# Patient Record
Sex: Female | Born: 1960 | Race: Asian | Hispanic: No | Marital: Single | Smoking: Current every day smoker
Health system: Southern US, Community
[De-identification: ages and names within clinical notes are randomized; demographics above are authoritative.]

## PROBLEM LIST (undated history)

## (undated) DIAGNOSIS — E119 Type 2 diabetes mellitus without complications: Secondary | ICD-10-CM

## (undated) HISTORY — PX: TUBAL LIGATION: SHX77

---

## 2011-10-14 ENCOUNTER — Encounter: Payer: Self-pay | Admitting: *Deleted

## 2011-10-14 ENCOUNTER — Emergency Department
Admission: EM | Admit: 2011-10-14 | Discharge: 2011-10-14 | Disposition: A | Discharge status: Home / Self Care | Payer: Self-pay | Source: Home / Self Care | Attending: Family Medicine | Admitting: Family Medicine

## 2011-10-14 ENCOUNTER — Emergency Department (INDEPENDENT_AMBULATORY_CARE_PROVIDER_SITE_OTHER): Payer: Self-pay

## 2011-10-14 DIAGNOSIS — R102 Pelvic and perineal pain: Secondary | ICD-10-CM

## 2011-10-14 DIAGNOSIS — N83209 Unspecified ovarian cyst, unspecified side: Secondary | ICD-10-CM

## 2011-10-14 DIAGNOSIS — N83201 Unspecified ovarian cyst, right side: Secondary | ICD-10-CM

## 2011-10-14 DIAGNOSIS — N949 Unspecified condition associated with female genital organs and menstrual cycle: Secondary | ICD-10-CM

## 2011-10-14 LAB — POCT URINALYSIS DIPSTICK
Ketones, UA: NEGATIVE
Spec Grav, UA: 1.015 (ref 1.005–1.03)
Urobilinogen, UA: 0.2 (ref 0–1)

## 2011-10-14 LAB — POCT CBC W AUTO DIFF (K'VILLE URGENT CARE)

## 2011-10-14 NOTE — ED Provider Notes (Signed)
History     CSN: [id number]  Arrival date & time 10/14/11  4098   First MD Initiated Contact with Patient 10/14/11 [phone]      Chief Complaint  Patient presents with  . Pelvic Pain     HPI Comments: Patient able to provide basic history, facilitated by her English speaking daughter. She complains of one week history of intermittent right lower quadrant pain, sometimes radiating to back.  No vaginal bleeding or discharge.  Patient's last menstrual period was 04/16/2011.  No urinary symptoms.  No fever.  She notes that her pain improves with Ibuprofen 400mg . Past history of BTL.  She has not had a pap smear in the past.  No recent pelvic exams  Patient is a 51 y.o. female presenting with abdominal pain. The history is provided by the patient and a relative. The history is limited by a language barrier. No language interpreter was used.  Abdominal Pain The primary symptoms of the illness include abdominal pain. The primary symptoms of the illness do not include fever, fatigue, shortness of breath, nausea, vomiting, diarrhea, dysuria, vaginal discharge or vaginal bleeding. Episode onset: 1 week ago. The onset of the illness was sudden.  Associated with: nothing. The patient has not had a change in bowel habit. Risk factors for an acute abdominal problem include a history of abdominal surgery. Additional symptoms associated with the illness include urgency and back pain. Symptoms associated with the illness do not include chills, anorexia, heartburn, constipation, hematuria or frequency.    History reviewed. No pertinent past medical history.  History reviewed.  Bilateral tubal ligation 17 years ago  Family history:  Parents with hypertension  History  Substance Use Topics  . Smoking status: Current Everyday Smoker -- 0.5 packs/day  . Smokeless tobacco: Not on file  . Alcohol Use: No    OB History    Grav Para Term Preterm Abortions TAB SAB Ect Mult Living                  Review  of Systems  Constitutional: Negative for fever, chills and fatigue.  Respiratory: Negative for shortness of breath.   Gastrointestinal: Positive for abdominal pain. Negative for heartburn, nausea, vomiting, diarrhea, constipation and anorexia.  Genitourinary: Positive for urgency. Negative for dysuria, frequency, hematuria, vaginal bleeding and vaginal discharge.  Musculoskeletal: Positive for back pain.  All other systems reviewed and are negative.    Allergies  Review of patient's allergies indicates no known allergies.  Home Medications  No current outpatient prescriptions on file.  BP 105/74  Pulse 76  Temp 97.5 F (36.4 C) (Oral)  Resp 14  Ht 5' 1.75" (1.568 m)  Wt 126 lb (57.153 kg)  BMI 23.23 kg/m2  SpO2 99%  LMP 04/16/2011  Physical Exam Nursing notes and Vital Signs reviewed. Appearance:  Patient appears healthy, stated age, and in no acute distress Eyes:  Pupils are equal, round, and reactive to light and accomodation.  Extraocular movement is intact.  Conjunctivae are not inflamed  Mouth/Pharynx:  Normal Neck:  Supple.  No adenopathy Lungs:  Clear to auscultation.  Breath sounds are equal.  Heart:  Regular rate and rhythm without murmurs, rubs, or gallops.  Abdomen:  Vague mild tenderness right lower quadrant over McBurney's point without masses or hepatosplenomegaly.  Bowel sounds are present.  No CVA or flank tenderness.  Extremities:  No edema.  No calf tenderness Skin:  No rash present.  Back:  Mild midline sacral tenderness ED Course  Procedures  none   Labs Reviewed  POCT URINALYSIS DIPSTICK gluc 100 mg/dL, otherwise negative  POCT CBC W AUTO DIFF ([facility])   WBC 7.1; LY 33.9; MO 6.9; GR 59.2; Hgb 12.4; Platelets 199    US Transvaginal Non-ob  10/14/2011  *RADIOLOGY REPORT*  Clinical Data: Pelvic pain.  Prior bilateral tubal ligation.  TRANSABDOMINAL AND TRANSVAGINAL ULTRASOUND OF PELVIS Technique:  Both transabdominal and transvaginal  ultrasound examinations of the pelvis were performed. Transabdominal technique was performed for global imaging of the pelvis including uterus, ovaries, adnexal regions, and pelvic cul-de-sac.  It was necessary to proceed with endovaginal exam following the transabdominal exam to visualize the uterus, endometrium, and ovaries.  Comparison:  None  Findings:  Uterus: Very minimally retroverted position.  Size 8.0 x 3.9 x 5.1 cm.  Myometrium appears unremarkable.  Endometrium: Mildly heterogeneous and 18 mm in width near the fundus.  Right ovary:  Complex collection of cysts and the suspected ovarian parenchyma measuring 8.6 x 4.6 x 5.3 cm, with multiple septations and color flow along the septations.  Images 61 through 69 suggest solid polypoid/papillary projections with internal flow within some of these cystic lesions.  Left ovary: Measures 5.8 x 3.0 x 4.5 cm, enlarged, and similar to the other side has multiple cystic and septated regions.  Other findings: No free fluid  IMPRESSION: 1.  Complex cysticmasses of both ovaries, particularly on the right which has solid elements. Appearance concerning for neoplasm such as cystadenoma.  Pelvic MRI with and without contrast using ovarian protocol is recommended. 2.  Mildly heterogeneous/widened endometrium may be due to polyp and can also be assessed at MRI. .   Original Report Authenticated By: Dellia Cloud, M.D.    US Pelvis Complete  10/14/2011  *RADIOLOGY REPORT*  Clinical Data: Pelvic pain.  Prior bilateral tubal ligation.  TRANSABDOMINAL AND TRANSVAGINAL ULTRASOUND OF PELVIS Technique:  Both transabdominal and transvaginal ultrasound examinations of the pelvis were performed. Transabdominal technique was performed for global imaging of the pelvis including uterus, ovaries, adnexal regions, and pelvic cul-de-sac.  It was necessary to proceed with endovaginal exam following the transabdominal exam to visualize the uterus, endometrium, and ovaries.   Comparison:  None  Findings:  Uterus: Very minimally retroverted position.  Size 8.0 x 3.9 x 5.1 cm.  Myometrium appears unremarkable.  Endometrium: Mildly heterogeneous and 18 mm in width near the fundus.  Right ovary:  Complex collection of cysts and the suspected ovarian parenchyma measuring 8.6 x 4.6 x 5.3 cm, with multiple septations and color flow along the septations.  Images 61 through 69 suggest solid polypoid/papillary projections with internal flow within some of these cystic lesions.  Left ovary: Measures 5.8 x 3.0 x 4.5 cm, enlarged, and similar to the other side has multiple cystic and septated regions.  Other findings: No free fluid  IMPRESSION: 1.  Complex cysticmasses of both ovaries, particularly on the right which has solid elements. Appearance concerning for neoplasm such as cystadenoma.  Pelvic MRI with and without contrast using ovarian protocol is recommended. 2.  Mildly heterogeneous/widened endometrium may be due to polyp and can also be assessed at MRI. .   Original Report Authenticated By: Dellia Cloud, M.D.      1. Pelvic pain in female   2. Bilateral ovarian cysts; ? Neoplasm such as cystadenoma       MDM  Recommend evaluation by GYN as soon as possible (given info for [facility]; patient elects to make her own appointment) Take  Ibuprofen 200mg , 3 or 4 tabs every 8 hours with food as needed for pelvic pain.        Lattie Haw, MD 10/14/11 [phone]

## 2011-10-14 NOTE — ED Notes (Signed)
Patient c/o pelvic pain intermittent x 1 week. Pain is severe at times and mild at times. She currently does not have pain. Denies any vaginal bleeding or discharge. Last BM was this am, bowels have been regular. She has taken motrin with relief.

## 2013-09-14 IMAGING — US US PELVIS COMPLETE
1 series · 13 of 25 positions shown · non-contrast
Comparison: None

CLINICAL DATA: Pelvic pain.  Prior bilateral tubal ligation.



[Series 1: us pelvis complete · 0.21mm/px · 13 of 69 slices shown]
[im 1/69]
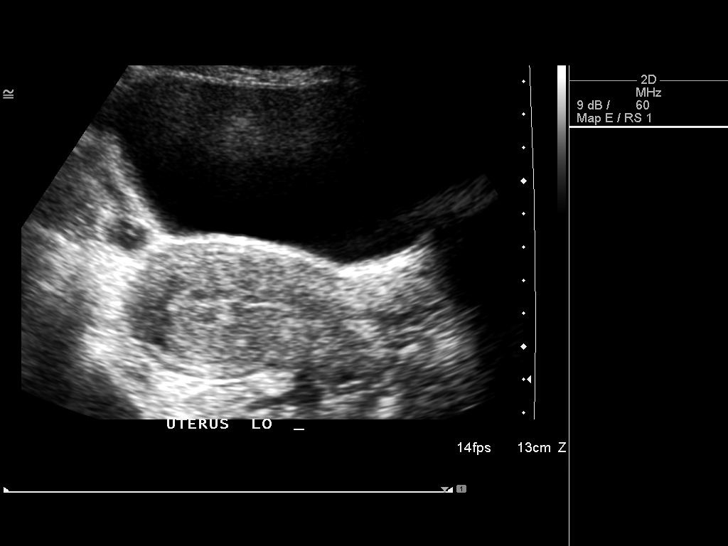
[im 6/69]
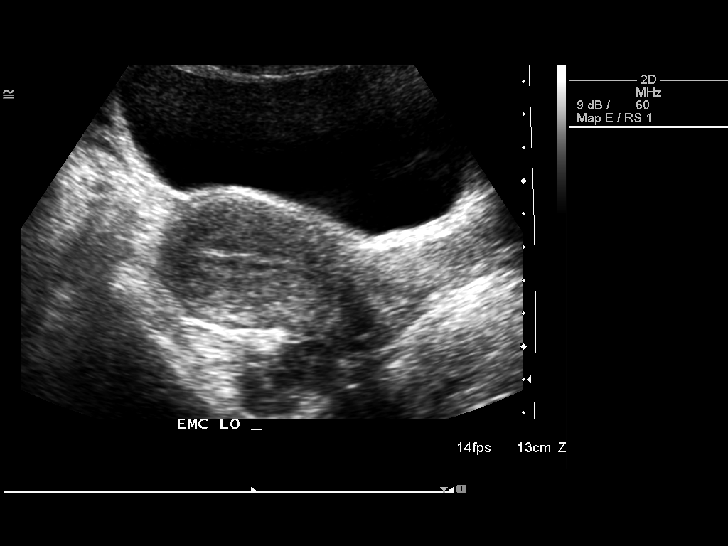
[im 12/69]
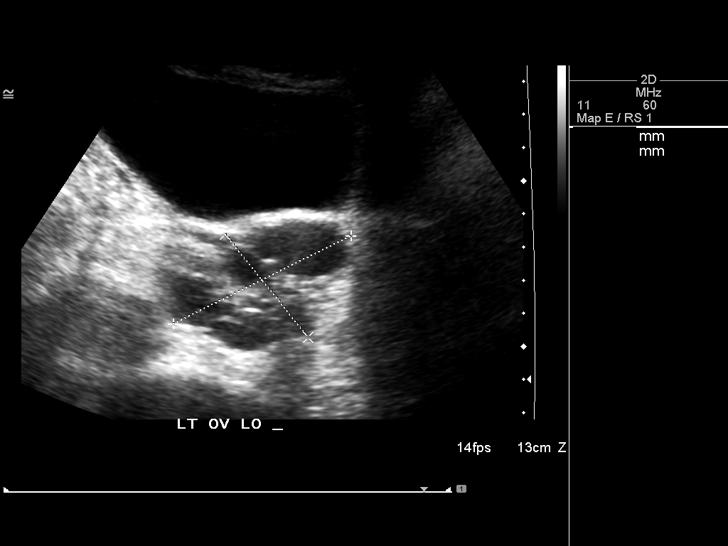
[im 18/69]
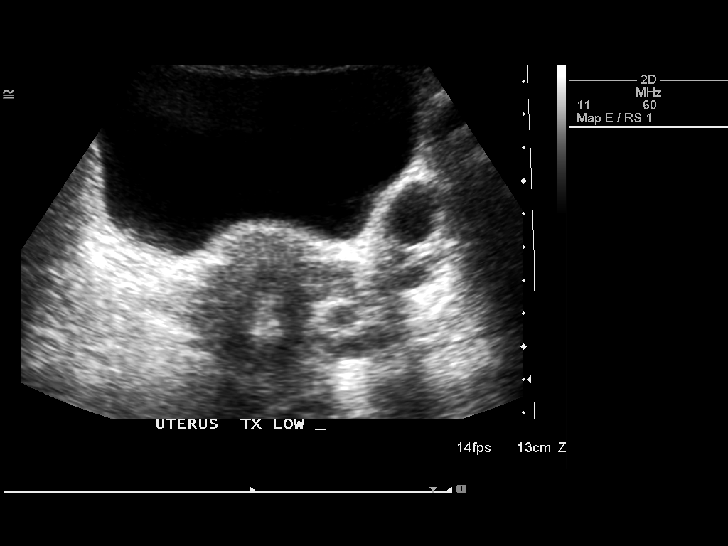
[im 23/69]
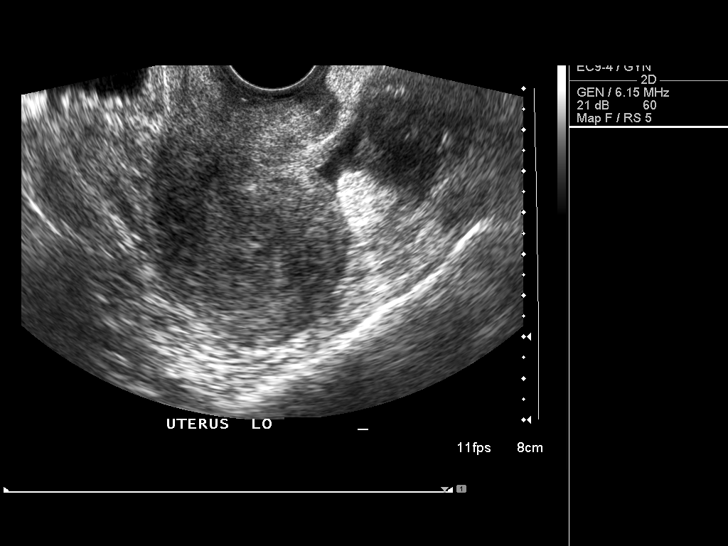
[im 29/69]
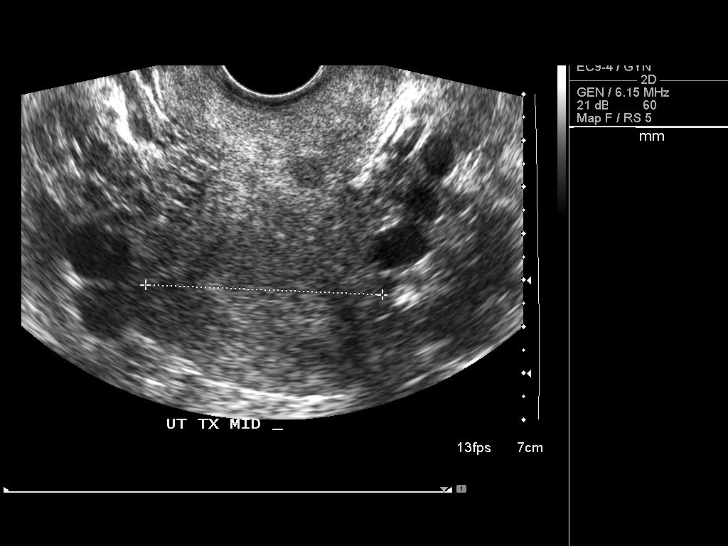
[im 35/69]
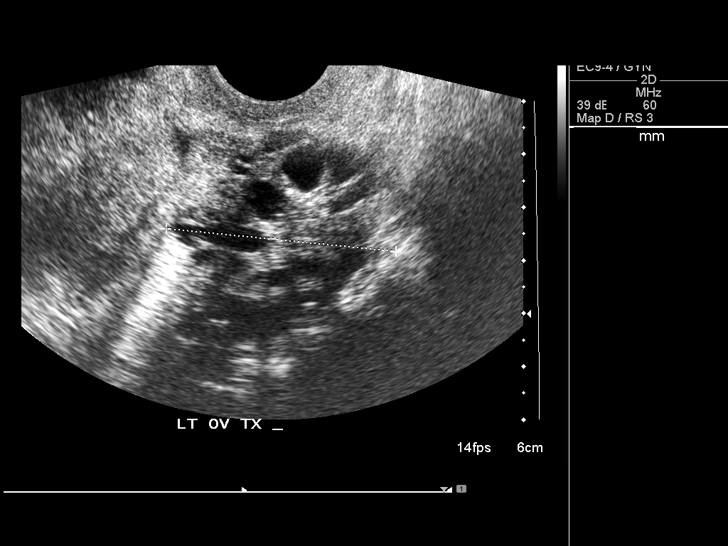
[im 40/69]
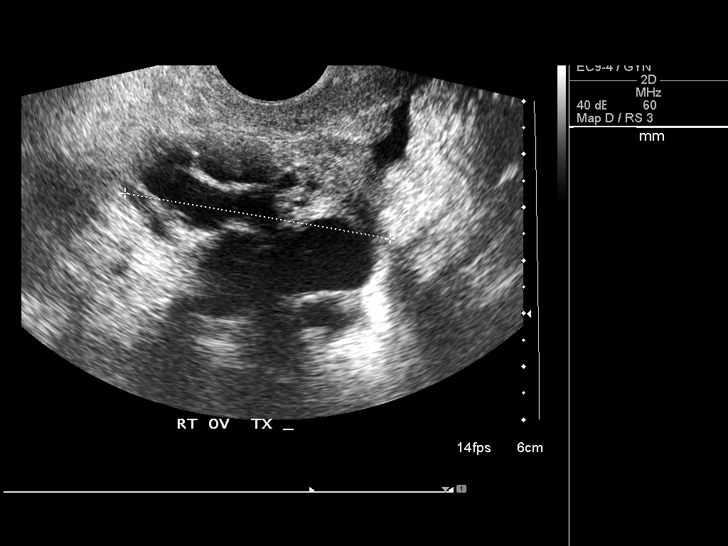
[im 46/69]
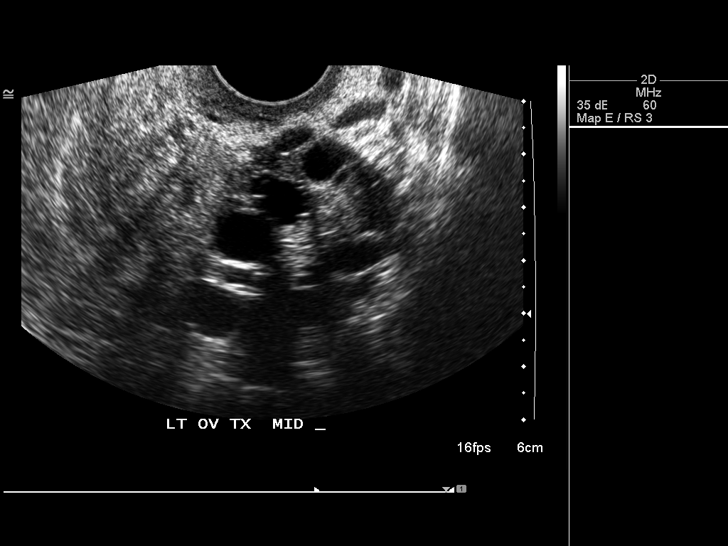
[im 52/69]
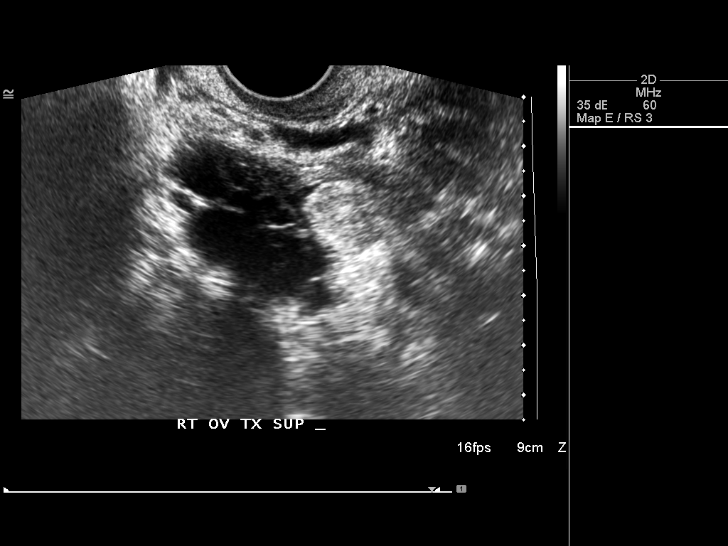
[im 57/69]
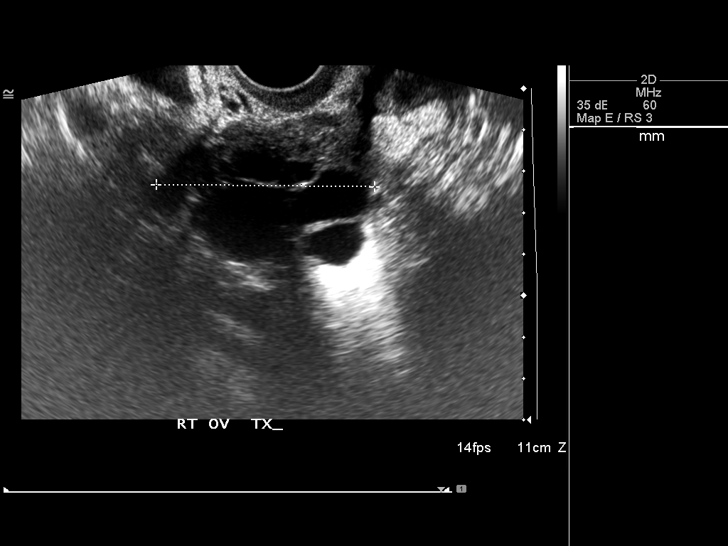
[im 63/69]
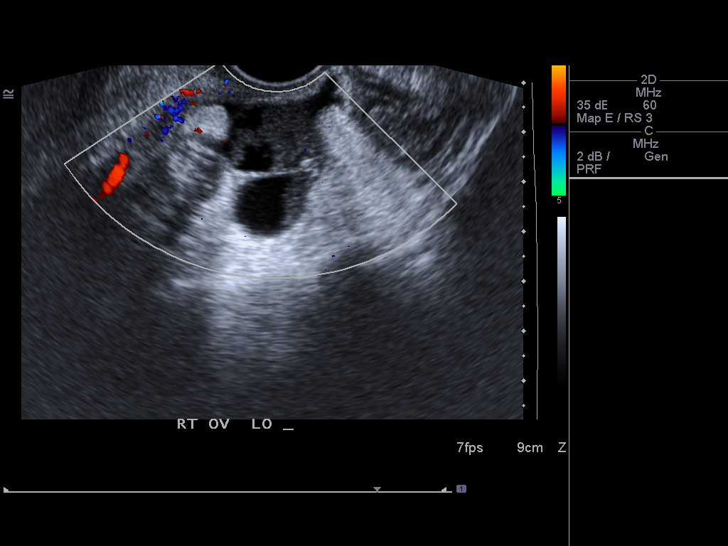
[im 69/69]
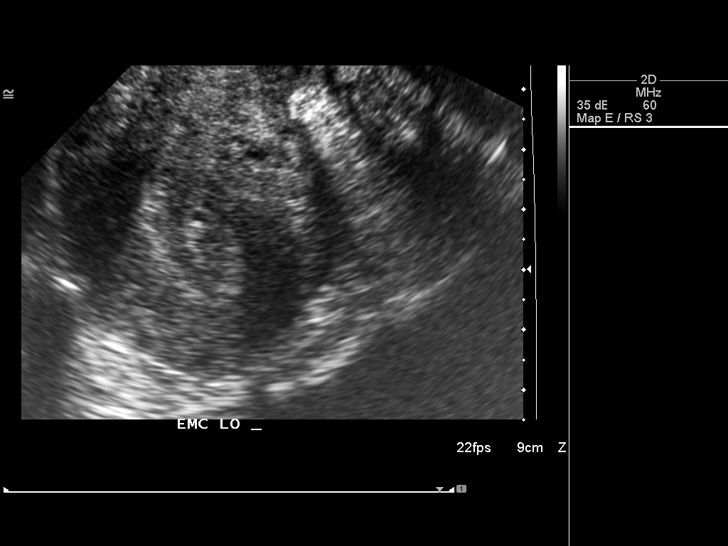

[13 of 25 positions shown; findings below may reference images not displayed]

FINDINGS: Uterus: Very minimally retroverted position.  Size 8.0 x 3.9 x
cm.  Myometrium appears unremarkable.

Endometrium: Mildly heterogeneous and 18 mm in width near the
fundus.

Right ovary:  Complex collection of cysts and the suspected ovarian
parenchyma measuring 8.6 x 4.6 x 5.3 cm, with multiple septations
and color flow along the septations.  Images 61 through 69 suggest
solid polypoid/papillary projections with internal flow within some
of these cystic lesions.

Left ovary: Measures 5.8 x 3.0 x 4.5 cm, enlarged, and similar to
the other side has multiple cystic and septated regions.

Other findings: No free fluid
IMPRESSION: 1.  Complex cysticmasses of both ovaries, particularly on the right
which has solid elements. Appearance concerning for neoplasm such
as cystadenoma.  Pelvic MRI with and without contrast using ovarian
protocol is recommended.
2.  Mildly heterogeneous/widened endometrium may be due to polyp
and can also be assessed at MRI. .

## 2016-01-08 ENCOUNTER — Encounter: Payer: Self-pay | Admitting: *Deleted

## 2016-01-08 ENCOUNTER — Emergency Department (INDEPENDENT_AMBULATORY_CARE_PROVIDER_SITE_OTHER)
Admission: EM | Admit: 2016-01-08 | Discharge: 2016-01-08 | Disposition: A | Discharge status: Home / Self Care | Payer: BLUE CROSS/BLUE SHIELD | Source: Home / Self Care | Attending: Family Medicine | Admitting: Family Medicine

## 2016-01-08 DIAGNOSIS — Z8639 Personal history of other endocrine, nutritional and metabolic disease: Secondary | ICD-10-CM

## 2016-01-08 DIAGNOSIS — R634 Abnormal weight loss: Secondary | ICD-10-CM

## 2016-01-08 DIAGNOSIS — R5383 Other fatigue: Secondary | ICD-10-CM | POA: Diagnosis not present

## 2016-01-08 DIAGNOSIS — D500 Iron deficiency anemia secondary to blood loss (chronic): Secondary | ICD-10-CM

## 2016-01-08 DIAGNOSIS — R3129 Other microscopic hematuria: Secondary | ICD-10-CM

## 2016-01-08 HISTORY — DX: Type 2 diabetes mellitus without complications: E11.9

## 2016-01-08 LAB — POCT CBC W AUTO DIFF (K'VILLE URGENT CARE)

## 2016-01-08 LAB — POCT URINALYSIS DIP (MANUAL ENTRY)
BILIRUBIN UA: NEGATIVE
Bilirubin, UA: NEGATIVE
Glucose, UA: NEGATIVE
LEUKOCYTES UA: NEGATIVE
Nitrite, UA: NEGATIVE
PROTEIN UA: NEGATIVE
Spec Grav, UA: 1.02 (ref 1.005–1.03)
Urobilinogen, UA: 0.2 (ref 0–1)
pH, UA: 6.5 (ref 5–8)

## 2016-01-08 NOTE — Discharge Instructions (Signed)
Continue daily multivitamin. 

## 2016-01-08 NOTE — ED Triage Notes (Signed)
Pt c/o 1 month of fatigue and not sleeping well. Tried taking 1 a day vitamin without any help.

## 2016-01-08 NOTE — ED Provider Notes (Signed)
[facility]    CSN: [id number] Arrival date & time: 01/08/16  1215     History   Chief Complaint Chief Complaint  Patient presents with  . Fatigue  . Sleeping Problem    HPI Erika Wells is a 55 y.o. female.   Patient complains of persistent fatigue for about one month.  She has had poor sleep (early morning awakening) for 2 to 3 months, and reports weight loss of about 20 pounds during the past 4 to 6 weeks.  She denies feeling down/depressed or increased stress.  She has had no improvement after beginning a daily multi-vitamin. Her command of the [place] language is somewhat limited but adequate. She has a history of kidney stones, assymptomatic at present, and takes metformin for type 2 diabetes.   The history is provided by the patient.    Past Medical History:  Diagnosis Date  . Diabetes mellitus without complication (HCC)     There are no active problems to display for this patient.   Past Surgical History:  Procedure Laterality Date  . TUBAL LIGATION      OB History    No data available       Home Medications    Prior to Admission medications   Medication Sig Start Date End Date Taking? Authorizing Provider  cetirizine (ZYRTEC) 10 MG tablet Take 10 mg by mouth daily.   Yes Historical Provider, MD  METFORMIN HCL PO Take by mouth.   Yes Historical Provider, MD    Family History Family History  Problem Relation Age of Onset  . Hypertension Mother   . Diabetes Mother   . Hypertension Father   . Diabetes Father     Social History Social History  Substance Use Topics  . Smoking status: Current Every Day Smoker    Packs/day: 0.50  . Smokeless tobacco: Never Used  . Alcohol use No     Allergies   Patient has no known allergies.   Review of Systems Review of Systems  Constitutional: Positive for activity change, fatigue and unexpected weight change. Negative for appetite change, chills, diaphoresis and fever.  HENT: Negative.     Eyes: Negative.   Respiratory: Negative.   Cardiovascular: Negative.   Gastrointestinal: Negative for abdominal pain, blood in stool, constipation, diarrhea, nausea and vomiting.  Endocrine: Negative for polyuria.  Genitourinary: Negative for flank pain, frequency, hematuria, pelvic pain and urgency.  Musculoskeletal: Negative.   Skin: Negative for rash.  Neurological: Negative for dizziness, light-headedness and headaches.     Physical Exam Triage Vital Signs ED Triage Vitals  Enc Vitals Group     BP 01/08/16 1259 120/71     Pulse Rate 01/08/16 1259 94     Resp --      Temp 01/08/16 1259 98.1 F (36.7 C)     Temp Source 01/08/16 1259 Oral     SpO2 01/08/16 1259 99 %     Weight 01/08/16 1259 96 lb (43.5 kg)     Height --      Head Circumference --      Peak Flow --      Pain Score 01/08/16 1301 0     Pain Loc --      Pain Edu? --      Excl. in GC? --    No data found.   Updated Vital Signs BP 120/71 (BP Location: Left Arm)   Pulse 94   Temp 98.1 F (36.7 C) (Oral)   Wt 96 lb (  43.5 kg)   LMP 04/16/2011   SpO2 99%   BMI 17.70 kg/m   Visual Acuity Right Eye Distance:   Left Eye Distance:   Bilateral Distance:    Right Eye Near:   Left Eye Near:    Bilateral Near:     Physical Exam Nursing notes and Vital Signs reviewed. Appearance:  Patient appears stated age, and in no acute distress.  Note low BMI 17.7. Eyes:  Pupils are equal, round, and reactive to light and accomodation.  Extraocular movement is intact.  Conjunctivae are not inflamed  Ears:  Canals normal.  Tympanic membranes normal.  Nose: Normal  turbinates.  No sinus tenderness.   Pharynx:  Normal Neck:  Supple.  No adenopathy or thyromegaly.   Lungs:  Clear to auscultation.  Breath sounds are equal.  Moving air well. Heart:  Regular rate and rhythm without murmurs, rubs, or gallops.  Abdomen:  Nontender without masses or hepatosplenomegaly.  Bowel sounds are present.  No CVA or flank  tenderness.  Extremities:  No edema.  Skin:  No rash present.    UC Treatments / Results  Labs (all labs ordered are listed, but only abnormal results are displayed) Labs Reviewed  COMPLETE METABOLIC PANEL WITH GFR       Result Value                 TSH -                 HEMOGLOBIN A1C -                  POCT URINALYSIS DIP (MANUAL ENTRY) - Abnormal; Notable for the following:    Blood, UA large (*)    All other components within normal limits  VITAMIN D 25 HYDROXY (VIT D DEFICIENCY, FRACTURES)   Narrative:    Performed at:  [facility]                [address], [address]                [place], [place] [address]  TRANSFERRIN   Narrative:    Performed at:  [facility]                [address]                [place], [place] [address]  FERRITIN   Narrative:    Performed at:  [facility]                [address]                [place], [place] [address]  POCT CBC W AUTO DIFF ([facility]):  WBC 4.8; LY 49.4; MO 6.6; GR 44.0; Hgb 10.9; Platelets 191; MCV 63.5L; MCH 20.1L     EKG  EKG Interpretation None       Radiology No results found.  Procedures Procedures (including critical care time)  Medications Ordered in UC Medications - No data to display   Initial Impression / Assessment and Plan / UC Course  I have reviewed the triage vital signs and the nursing notes.  Pertinent labs & imaging results that were available during my care of the patient were reviewed by me and considered in my medical decision making (see chart for details).  Clinical Course   Unremarkable Physical Exam. Note Hgb 10.9 with microcytic, hypochromic indices, and urinalysis showing  significant microscopic hematuria.  Suspect that her anemia is a result of chronic blood loss. With her history of kidney stones, recommend urology evaluation.  Will check ferritin and transferrin. Check status of diabetes with Hgb  A1c. Check CMP, TSH, Vitamin D level. Continue daily multi vitamin. Followup with PCP for further evaluation and management.     Final Clinical Impressions(s) / UC Diagnoses   Final diagnoses:  Fatigue, unspecified type  Weight loss  Iron deficiency anemia due to chronic blood loss  Microscopic hematuria    New Prescriptions Discharge Medication List as of 01/08/2016  2:08 PM       Lattie HawStephen A Audric Venn, MD 01/10/16 1105

## 2016-01-09 ENCOUNTER — Telehealth: Payer: Self-pay | Admitting: *Deleted

## 2016-01-09 LAB — COMPLETE METABOLIC PANEL WITH GFR
ALT: 24 U/L (ref 6–29)
AST: 20 U/L (ref 10–35)
Albumin: 4.1 g/dL (ref 3.6–5.1)
Alkaline Phosphatase: 101 U/L (ref 33–130)
BILIRUBIN TOTAL: 0.6 mg/dL (ref 0.2–1.2)
BUN: 10 mg/dL (ref 7–25)
CO2: 25 mmol/L (ref 20–31)
CREATININE: 0.37 mg/dL — AB (ref 0.50–1.05)
Calcium: 9.5 mg/dL (ref 8.6–10.4)
Chloride: 109 mmol/L (ref 98–110)
GFR, Est Non African American: >89 mL/min (ref >=60)
GLUCOSE: 89 mg/dL (ref 65–99)
Potassium: 4.2 mmol/L (ref 3.5–5.3)
Sodium: 145 mmol/L (ref 135–146)
TOTAL PROTEIN: 6.4 g/dL (ref 6.1–8.1)

## 2016-01-09 LAB — TRANSFERRIN: Transferrin: 234 mg/dL (ref 188–341)

## 2016-01-09 LAB — VITAMIN D 25 HYDROXY (VIT D DEFICIENCY, FRACTURES): Vit D, 25-Hydroxy: 34 ng/mL (ref 30–100)

## 2016-01-09 LAB — HEMOGLOBIN A1C
HEMOGLOBIN A1C: 6.1 % — AB (ref <5.7)
Mean Plasma Glucose: 128 mg/dL

## 2016-01-09 LAB — TSH: TSH: <0.01 mIU/L — ABNORMAL LOW

## 2016-01-09 LAB — FERRITIN: FERRITIN: 137 ng/mL (ref 10–232)

## 2016-01-09 NOTE — Telephone Encounter (Signed)
Callback: Patient advised of lab results. I called her PCP office and [facility]14[facility]16 with Dr. Altamease Oileresa @ 8am. Labs and nursing notes faxed. [phone]. Patient repeated appointment time and location, verbalized she could attend.
